# Patient Record
Sex: Female | Born: 1960 | Race: Black or African American | Hispanic: No | Smoking: Current some day smoker
Health system: Southern US, Community
[De-identification: ages and names within clinical notes are randomized; demographics above are authoritative.]

## PROBLEM LIST (undated history)

## (undated) DIAGNOSIS — S43006A Unspecified dislocation of unspecified shoulder joint, initial encounter: Secondary | ICD-10-CM

---

## 2017-05-04 ENCOUNTER — Encounter (INDEPENDENT_AMBULATORY_CARE_PROVIDER_SITE_OTHER): Payer: Self-pay | Admitting: Orthopedic Surgery

## 2017-05-04 ENCOUNTER — Ambulatory Visit (INDEPENDENT_AMBULATORY_CARE_PROVIDER_SITE_OTHER): Payer: PRIVATE HEALTH INSURANCE | Admitting: Orthopedic Surgery

## 2017-05-04 ENCOUNTER — Ambulatory Visit (INDEPENDENT_AMBULATORY_CARE_PROVIDER_SITE_OTHER): Payer: PRIVATE HEALTH INSURANCE

## 2017-05-04 DIAGNOSIS — G8929 Other chronic pain: Secondary | ICD-10-CM | POA: Diagnosis not present

## 2017-05-04 DIAGNOSIS — M5442 Lumbago with sciatica, left side: Secondary | ICD-10-CM

## 2017-05-04 NOTE — Progress Notes (Signed)
Office Visit Note   Patient: Brenda Adams           Date of Birth: 01-21-1961           MRN: [id number] Visit Date: 05/04/2017 Requested by: No referring provider defined for this encounter. PCP: System, Pcp Not In  Subjective: Chief Complaint  Patient presents with  . Lower Back - Pain    HPI: Brenda Adams is a 56 year old patient with low back pain.  She's referred here from the rehabilitation.  She's had pain for 20 years.  States the pain is constant.  She denies any prior surgery.  Does state that the pain will radiate down the left leg at times of some numbness and tingling in this area.  Occasionally she will wake with pain.  She has been to a [facility].  She has not had injections.  She does state that she had an MRI scan in March.  She may be trying to achieve service connection with this back injury because she states that she is working with the [facility] with this as well.  She states that her back symptoms to prevent other type of more physical work.  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  She does describe no limitation of walking endurance.  Does report some paresthesias in the lateral left leg.  She is seen in nutrition is to who is recommended working out 5 days a week.  She's been working out for less than a month doing walking weights and abdominal work.              ROS: All systems reviewed are negative as they relate to the chief complaint within the history of present illness.  Patient denies  fevers or chills.   Assessment & Plan: Visit Diagnoses:  1. Chronic midline low back pain with left-sided sciatica     Plan: Impression is low back pain with fairly significant degenerative changes on plain radiographs.  The patient may be having some foraminal stenosis of osteophytes affecting the left nerve root.  In general sedentary type work with no lifting more than 10-15 pounds occasionally would be her best  option.  She stated that she was thinking about doing nursing but I don't know with her back to waiting is that that'll be her best option for employment.  In general something sedentary or with light activity which involves less than 15 pounds of lifting is going to be more ideal for her.  I'll see her back as needed  Follow-Up Instructions: No Follow-up on file.   Orders:  Orders Placed This Encounter  Procedures  . XR Lumbar Spine 2-3 Views   No orders of the defined types were placed in this encounter.     Procedures: No procedures performed   Clinical Data: No additional findings.  Objective: Vital Signs: There were no vitals taken for this visit.  Physical Exam:   Constitutional: Patient appears well-developed HEENT:  Head: Normocephalic Eyes:EOM are normal Neck: Normal range of motion Cardiovascular: Normal rate Pulmonary/chest: Effort normal Neurologic: Patient is alert Skin: Skin is warm Psychiatric: Patient has normal mood and affect    Ortho Exam: Orthopedic exam demonstrates normal gait alignment palpable pedal pulses good ankle dorsi and plantar flexion quite hamstring strength mild paresthesias L5 disc extrusion on the left negative Babinski negative clonus no groin pain with internal/external rotation of the leg not much in the way of pain with forward  lateral bending and no trochanteric tenderness is noted.  Specialty Comments:  No specialty comments available.  Imaging: Xr Lumbar Spine 2-3 Views  Result Date: 05/04/2017 AP lateral lumbar spine reviewed.  Hip joints and sacroiliac joints appear normal.  No fractures present.  Mild scoliosis which is degenerative in nature is present in the lower lumbar spine.  Significant degenerative disc disease noted at L4-5 and L5-S1 with facet arthritis present.  Disc spaces are maintained at L1-2 and above.    PMFS History: There are no active problems to display for this patient.  No past medical history on  file.  No family history on file.  No past surgical history on file. Social History   Occupational History  . Not on file.   Social History Main Topics  . Smoking status: Current Some Day Smoker  . Smokeless tobacco: Never Used  . Alcohol use Not on file  . Drug use: Unknown  . Sexual activity: Not on file

## 2017-06-05 ENCOUNTER — Telehealth (INDEPENDENT_AMBULATORY_CARE_PROVIDER_SITE_OTHER): Payer: Self-pay | Admitting: Orthopedic Surgery

## 2017-06-05 NOTE — Telephone Encounter (Signed)
05/04/2017 OV note faxed to [facility] [phone]

## 2017-12-08 ENCOUNTER — Other Ambulatory Visit: Payer: Self-pay

## 2017-12-08 ENCOUNTER — Emergency Department (HOSPITAL_COMMUNITY): Payer: Self-pay

## 2017-12-08 ENCOUNTER — Encounter (HOSPITAL_COMMUNITY): Payer: Self-pay | Admitting: Emergency Medicine

## 2017-12-08 ENCOUNTER — Emergency Department (HOSPITAL_COMMUNITY)
Admission: EM | Admit: 2017-12-08 | Discharge: 2017-12-08 | Disposition: A | Discharge status: Home / Self Care | Payer: Self-pay | Attending: Emergency Medicine | Admitting: Emergency Medicine

## 2017-12-08 DIAGNOSIS — Y929 Unspecified place or not applicable: Secondary | ICD-10-CM | POA: Insufficient documentation

## 2017-12-08 DIAGNOSIS — F17200 Nicotine dependence, unspecified, uncomplicated: Secondary | ICD-10-CM | POA: Insufficient documentation

## 2017-12-08 DIAGNOSIS — Y999 Unspecified external cause status: Secondary | ICD-10-CM | POA: Insufficient documentation

## 2017-12-08 DIAGNOSIS — Z79899 Other long term (current) drug therapy: Secondary | ICD-10-CM | POA: Insufficient documentation

## 2017-12-08 DIAGNOSIS — X509XXA Other and unspecified overexertion or strenuous movements or postures, initial encounter: Secondary | ICD-10-CM | POA: Insufficient documentation

## 2017-12-08 DIAGNOSIS — S43005A Unspecified dislocation of left shoulder joint, initial encounter: Secondary | ICD-10-CM | POA: Insufficient documentation

## 2017-12-08 DIAGNOSIS — Y9389 Activity, other specified: Secondary | ICD-10-CM | POA: Insufficient documentation

## 2017-12-08 HISTORY — DX: Unspecified dislocation of unspecified shoulder joint, initial encounter: S43.006A

## 2017-12-08 NOTE — ED Triage Notes (Signed)
Pt st's she was cleaning and dislocated her left shoulder.  Pt st's she has dislocated it in the past and it usually goes back in but today it did not.  Pt c/o severe pain to shoulder

## 2017-12-08 NOTE — ED Provider Notes (Signed)
I saw and evaluated the patient, reviewed the resident's note and I agree with the findings and plan.   EKG Interpretation None     57 year old female presents with severe left shoulder pain when she reached for something while doing housework.  History of recurrent shoulder dislocation.  X-ray here shows anterior shoulder dislocation which was relocated by the resident.  Please see his note.  On my exam, patient is now relocated after his reduction.  Will check postreduction films.  Will give orthopedic referral   Lorre NickAllen, Farah Benish, MD 12/08/17 2121

## 2017-12-08 NOTE — ED Notes (Signed)
Pt s slt shoulder has been dislocated  Since 1800 she did while cleaning  Previous dislocations   She drove herself here

## 2017-12-08 NOTE — ED Provider Notes (Signed)
[facility] Provider Note   CSN: [id number] Arrival date & time: 12/08/17  2020     History   Chief Complaint Chief Complaint  Patient presents with  . Shoulder Injury    HPI Brenda Adams is a 57 y.o. female.  HPI  57 year old right-handed female presents the emergency department status post suspected dislocation to the left shoulder with known history of chronic subluxation/dislocation.  Patient states this happened 5 hours ago with no acute mechanism of injury.  Patient describes mild paresthesia to the fingertips.  Negative weakness/paralysis.  Negative antiplatelet/anticoagulant therapy.  Past Medical History:  Diagnosis Date  . Dislocated shoulder     There are no active problems to display for this patient.   History reviewed. No pertinent surgical history.  OB History    No data available       Home Medications    Prior to Admission medications   Medication Sig Start Date End Date Taking? Authorizing Provider  loratadine (CLARITIN) 10 MG tablet Take 10 mg by mouth daily.   Yes [provider]    Family History No family history on file.  Social History Social History   Tobacco Use  . Smoking status: Current Some Day Smoker  . Smokeless tobacco: Never Used  Substance Use Topics  . Alcohol use: No    Frequency: Never  . Drug use: No     Allergies   Patient has no known allergies.   Review of Systems Review of Systems  Review of Systems  Constitutional: Negative for fever and chills.  HENT: Negative for ear pain, sore throat and trouble swallowing.   Eyes: Negative for pain and visual disturbance.  Respiratory: Negative for cough and shortness of breath.   Cardiovascular: Negative for chest pain and leg swelling.  Gastrointestinal: Negative for nausea, vomiting, abdominal pain and diarrhea.  Genitourinary: Negative for dysuria, urgency and frequency.  Musculoskeletal: see HPI  Skin:  Negative for rash and wound.  Neurological: Negative for dizziness, syncope, speech difficulty, weakness and numbness.   Physical Exam Updated Vital Signs BP (!) 129/118   Pulse 67   Temp 98.5 F (36.9 C) (Oral)   Resp 15   Ht 5\' 8"  (1.727 m)   Wt 111.6 kg (246 lb)   LMP  (Exact Date)   SpO2 98%   BMI 37.40 kg/m   Physical Exam  Physical Exam Vitals:   12/08/17 2115 12/08/17 2145  BP: (!) 125/107 (!) 129/118  Pulse: 67   Resp: 15   Temp:    SpO2: 98%    Constitutional: Patient is in no acute distress Head: Normocephalic and atraumatic.  Eyes: Extraocular motion intact, no scleral icterus Neck: Supple without meningismus, mass, or overt JVD Respiratory: Effort normal and breath sounds normal. No respiratory distress. CV: Heart regular rate and rhythm, no obvious murmurs.  Pulses +2 and symmetric Abdomen: Soft, non-tender, non-distended MSK: L shoulder deformity; pulse 2+; sensory intact.  Skin: Warm, dry, intact Neuro: Alert and oriented, no motor deficit noted Psychiatric: Mood and affect are normal.  ED Treatments / Results  Labs (all labs ordered are listed, but only abnormal results are displayed) Labs Reviewed - No data to display  EKG  EKG Interpretation None       Radiology Dg Shoulder Left  Result Date: 12/08/2017 CLINICAL DATA:  Post reduction of left shoulder dislocation EXAM: LEFT SHOULDER - 2+ VIEW COMPARISON:  None. FINDINGS: Satisfactory reduction of left humeral head. [facility] and glenohumeral  joints are maintained. No definite bony Bankart or Hill-Sachs deformity is identified post reduction. The included left ribs and lung are nonacute. IMPRESSION: Satisfactory reduction of previously dislocated left humeral head. No bony Bankart or Hill-Sachs deformity. Electronically Signed   By: Tollie Eth M.D.   On: 12/08/2017 22:02   Dg Shoulder Left  Result Date: 12/08/2017 CLINICAL DATA:  Recurrent shoulder dislocation. EXAM: LEFT SHOULDER - 2+ VIEW  COMPARISON:  None. FINDINGS: Anterior subcoracoid left humeral head dislocation is noted with probable Hill-Sachs deformity. No definite bony Bankart lesion. AC joint is maintained. The adjacent ribs and lung are nonacute. IMPRESSION: Anterior subcoracoid left humeral head dislocation with probable Sachs deformity. Electronically Signed   By: Tollie Eth M.D.   On: 12/08/2017 21:26    Procedures Reduction of dislocation Date/Time: 12/08/2017 9:22 PM Performed by: Jaynie Husband, DO Authorized by: Jaynie Guard, DO  Consent: Verbal consent obtained. Consent given by: patient Patient identity confirmed: verbally with patient and arm band Local anesthesia used: no  Anesthesia: Local anesthesia used: no  Sedation: Patient sedated: no  Patient tolerance: Patient tolerated the procedure well with no immediate complications Comments: Manual reduction of L shoulder. NVI post reduction. Films confirm reduction.     (including critical care time)  Medications Ordered in ED Medications - No data to display   Initial Impression / Assessment and Plan / ED Course  I have reviewed the triage vital signs and the nursing notes.  Pertinent labs & imaging results that were available during my care of the patient were reviewed by me and considered in my medical decision making (see chart for details).     57 year old right-handed female presents the emergency department status post suspected dislocation to the left shoulder with known history of chronic subluxation/dislocation.  Patient states this happened 5 hours ago with no acute mechanism of injury.  Patient describes mild paresthesia to the fingertips.  Negative weakness/paralysis.  Negative antiplatelet/anticoagulant therapy.  Patient arrived here medically stable well-appearing.  Physical exam as annotated above consistent with history/clinical presentation of left shoulder dislocation.  X-rays confirm anterior shoulder dislocation.  Manual  reduction performed in the emergency department with post reduction films confirming reduction.  Patient has no neurovascular deficits.  Plan for follow-up to orthopedics.  Patient instructed conservative therapy to include swelling and anti-inflammatory meds.  Good return precautions were given.  Final Clinical Impressions(s) / ED Diagnoses   Final diagnoses:  Dislocation of left shoulder joint, initial encounter    ED Discharge Orders    None       Jaynie Solomon, DO 12/08/17 2224

## 2018-07-14 IMAGING — DX DG SHOULDER 2+V*L*
2 series · 2 of 2 positions shown · non-contrast
Comparison: None.

CLINICAL DATA: Recurrent shoulder dislocation.

EXAM:
LEFT SHOULDER - 2+ VIEW

[shoulder y view]
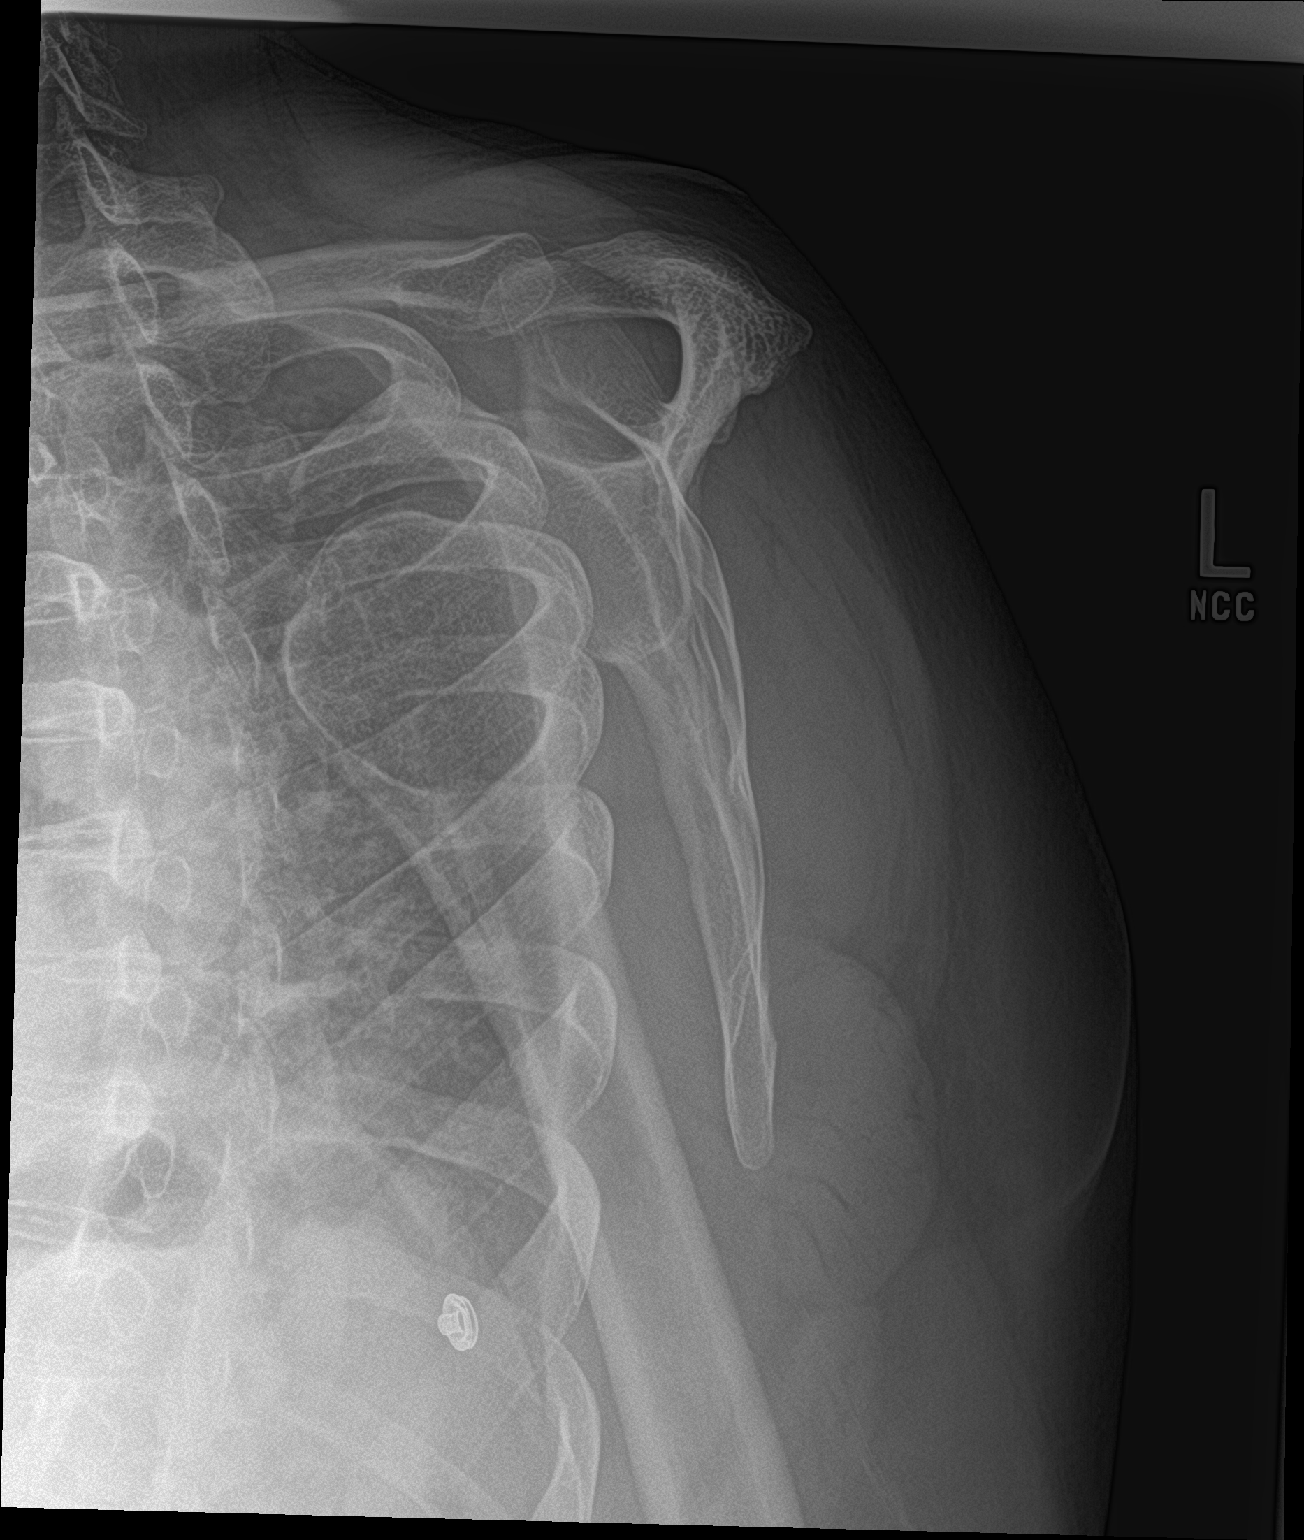

[shoulder ap neutral]
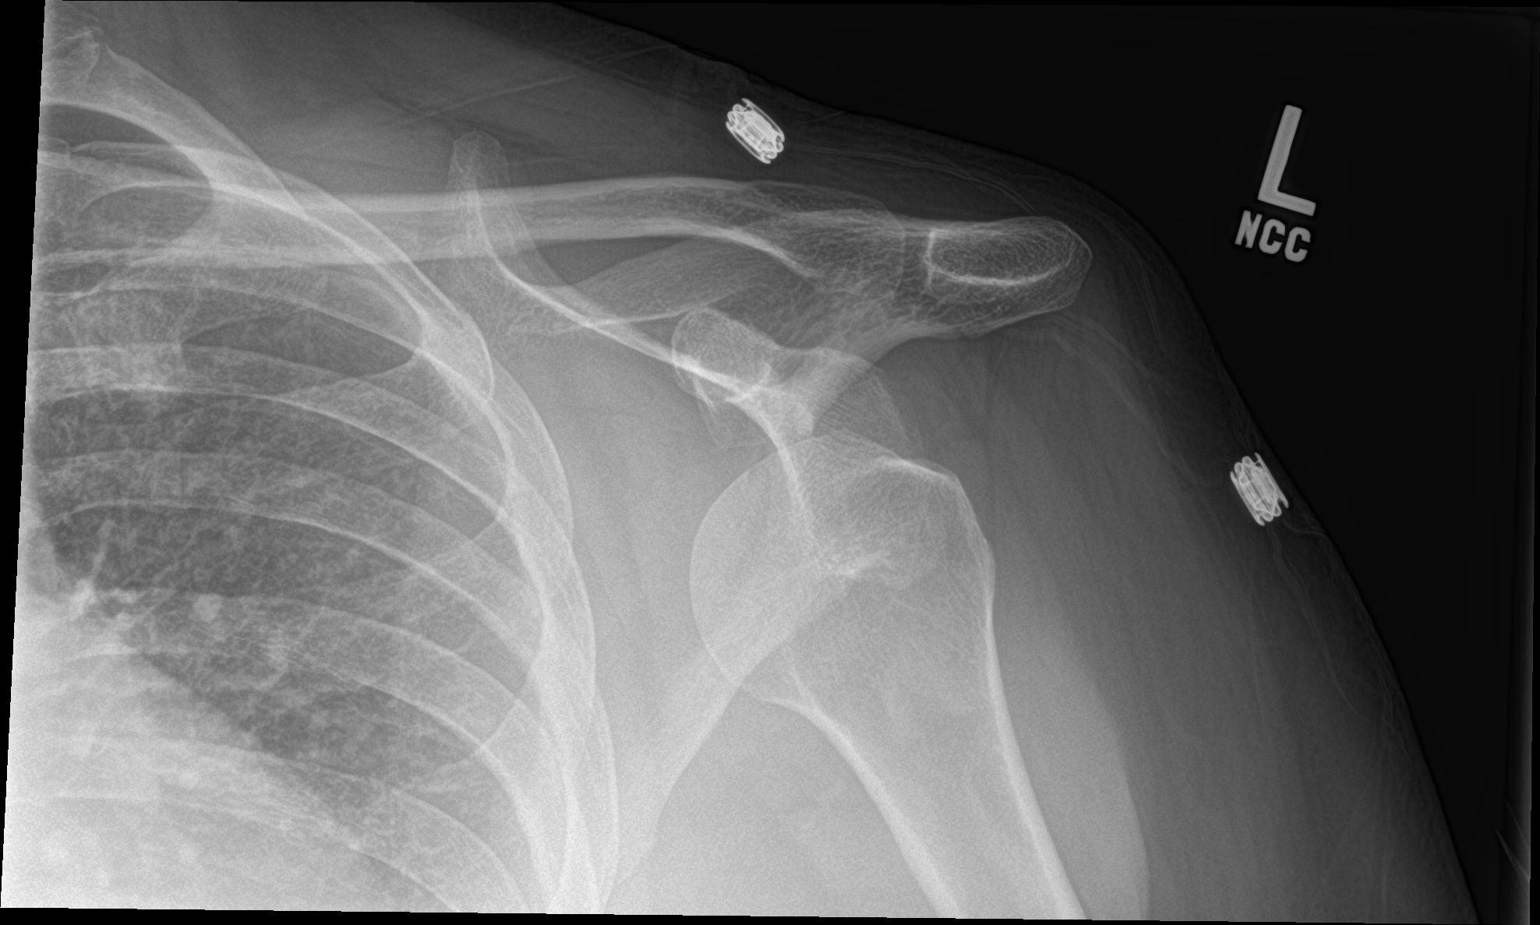

[2 of 2 positions shown; findings below may reference images not displayed]

FINDINGS: Anterior subcoracoid left humeral head dislocation is noted with
probable Hill-Sachs deformity. No definite bony Bankart lesion. AC
joint is maintained. The adjacent ribs and lung are nonacute.
IMPRESSION: Anterior subcoracoid left humeral head dislocation with probable
Sachs deformity.

## 2022-09-28 ENCOUNTER — Other Ambulatory Visit (HOSPITAL_BASED_OUTPATIENT_CLINIC_OR_DEPARTMENT_OTHER): Payer: Self-pay | Admitting: Orthopedic Surgery

## 2022-09-28 DIAGNOSIS — M25512 Pain in left shoulder: Secondary | ICD-10-CM

## 2022-10-06 ENCOUNTER — Ambulatory Visit (HOSPITAL_BASED_OUTPATIENT_CLINIC_OR_DEPARTMENT_OTHER)
Admission: RE | Admit: 2022-10-06 | Discharge: 2022-10-06 | Disposition: A | Discharge status: Home / Self Care | Payer: No Typology Code available for payment source | Source: Ambulatory Visit | Attending: Orthopedic Surgery | Admitting: Orthopedic Surgery

## 2022-10-06 DIAGNOSIS — M19012 Primary osteoarthritis, left shoulder: Secondary | ICD-10-CM | POA: Diagnosis not present

## 2022-10-06 DIAGNOSIS — M67814 Other specified disorders of tendon, left shoulder: Secondary | ICD-10-CM | POA: Insufficient documentation

## 2022-10-06 DIAGNOSIS — G8929 Other chronic pain: Secondary | ICD-10-CM | POA: Diagnosis present

## 2022-10-06 DIAGNOSIS — M25512 Pain in left shoulder: Secondary | ICD-10-CM

## 2024-08-28 ENCOUNTER — Emergency Department (HOSPITAL_BASED_OUTPATIENT_CLINIC_OR_DEPARTMENT_OTHER)

## 2024-08-28 ENCOUNTER — Other Ambulatory Visit: Payer: Self-pay

## 2024-08-28 ENCOUNTER — Emergency Department (HOSPITAL_BASED_OUTPATIENT_CLINIC_OR_DEPARTMENT_OTHER)
Admission: EM | Admit: 2024-08-28 | Discharge: 2024-08-28 | Disposition: A | Discharge status: Home / Self Care | Attending: Emergency Medicine | Admitting: Emergency Medicine

## 2024-08-28 DIAGNOSIS — I10 Essential (primary) hypertension: Secondary | ICD-10-CM | POA: Insufficient documentation

## 2024-08-28 DIAGNOSIS — Z720 Tobacco use: Secondary | ICD-10-CM | POA: Insufficient documentation

## 2024-08-28 DIAGNOSIS — Z79899 Other long term (current) drug therapy: Secondary | ICD-10-CM | POA: Insufficient documentation

## 2024-08-28 DIAGNOSIS — H538 Other visual disturbances: Secondary | ICD-10-CM | POA: Diagnosis not present

## 2024-08-28 DIAGNOSIS — H5711 Ocular pain, right eye: Secondary | ICD-10-CM | POA: Insufficient documentation

## 2024-08-28 LAB — BASIC METABOLIC PANEL WITH GFR
Anion gap: 11 (ref 5–15)
BUN: 12 mg/dL (ref 8–23)
CO2: 24 mmol/L (ref 22–32)
Calcium: 9.8 mg/dL (ref 8.9–10.3)
Chloride: 106 mmol/L (ref 98–111)
Creatinine, Ser: 0.96 mg/dL (ref 0.44–1.00)
GFR, Estimated: >60 mL/min (ref >60)
Glucose, Bld: 130 mg/dL — ABNORMAL HIGH (ref 70–99)
Potassium: 4.1 mmol/L (ref 3.5–5.1)
Sodium: 140 mmol/L (ref 135–145)

## 2024-08-28 LAB — CBC WITH DIFFERENTIAL/PLATELET
Abs Immature Granulocytes: 0.03 K/uL (ref 0.00–0.07)
Basophils Absolute: 0.1 K/uL (ref 0.0–0.1)
Basophils Relative: 1 %
Eosinophils Absolute: 0.2 K/uL (ref 0.0–0.5)
Eosinophils Relative: 2 %
HCT: 40.1 % (ref 36.0–46.0)
Hemoglobin: 13.5 g/dL (ref 12.0–15.0)
Immature Granulocytes: 0 %
Lymphocytes Relative: 42 %
Lymphs Abs: 3.3 K/uL (ref 0.7–4.0)
MCH: 31.2 pg (ref 26.0–34.0)
MCHC: 33.7 g/dL (ref 30.0–36.0)
MCV: 92.6 fL (ref 80.0–100.0)
Monocytes Absolute: 0.6 K/uL (ref 0.1–1.0)
Monocytes Relative: 8 %
Neutro Abs: 3.7 K/uL (ref 1.7–7.7)
Neutrophils Relative %: 47 %
Platelets: 262 K/uL (ref 150–400)
RBC: 4.33 MIL/uL (ref 3.87–5.11)
RDW: 15.1 % (ref 11.5–15.5)
WBC: 7.8 K/uL (ref 4.0–10.5)
nRBC: 0 % (ref 0.0–0.2)

## 2024-08-28 MED ORDER — TETRACAINE HCL 0.5 % OP SOLN
1.0000 [drp] | Freq: Once | OPHTHALMIC | Status: AC
  Administered 2024-08-28: 1 [drp] via OPHTHALMIC
  Filled 2024-08-28: qty 4

## 2024-08-28 MED ORDER — FLUORESCEIN SODIUM 1 MG OP STRP
1.0000 | ORAL_STRIP | Freq: Once | OPHTHALMIC | Status: DC
  Filled 2024-08-28: qty 1

## 2024-08-28 NOTE — ED Notes (Signed)
  fluorescein ophthalmic strip 1 strip and tetracaine at bedside

## 2024-08-28 NOTE — ED Triage Notes (Signed)
 Pt POV reporting seeing blurry white patch in R eye and pain pain when looking around since Sunday.

## 2024-08-28 NOTE — ED Notes (Signed)

## 2024-08-28 NOTE — ED Provider Notes (Signed)
 [place] EMERGENCY DEPARTMENT AT [facility] Provider Note   CSN: [id number] Arrival date & time: 08/28/24  1509     Patient presents with: Eye Pain   Brenda Adams is a 63 y.o. female with past medical history of HTN, tobacco use, migraines presents emergency department for evaluation of visual disturbance that started 4 days ago.  Reports that symptoms initially started with complete loss of vision in her right eye that lasted 3-5 seconds and spontaneously resolved.  Following this, she had white spots mostly located in her medial aspect of her vision.  She states it feels like everything is brighter and blurry in the right eye.  Also has associated right eye pain that does not worsen with EOM just is constant.  Has a history of migraines but has not had any headaches nor does this feel like her typical migraine. Denies dizziness, traumatic injury, fevers     Eye Pain       Prior to Admission medications   Medication Sig Start Date End Date Taking? Authorizing Provider  loratadine (CLARITIN) 10 MG tablet Take 10 mg by mouth daily.    [provider]    Allergies: Penicillins    Review of Systems  Eyes:  Positive for pain.    Updated Vital Signs BP (!) 154/92 (BP Location: Right Arm)   Pulse (!) 58   Temp 97.9 F (36.6 C)   Resp 18   Ht 5' 8 (1.727 m)   Wt 106.6 kg   SpO2 97%   BMI 35.73 kg/m   Physical Exam Vitals and nursing note reviewed.  Constitutional:      General: She is not in acute distress.    Appearance: Normal appearance.  HENT:     Head: Normocephalic and atraumatic.  Eyes:     General: Visual field deficit present.     Intraocular pressure: Right eye pressure is 16 mmHg.     Extraocular Movements:     Right eye: Normal extraocular motion and no nystagmus.     Left eye: Normal extraocular motion and no nystagmus.     Conjunctiva/sclera: Conjunctivae normal.     Right eye: Right conjunctiva is not injected. No chemosis,  exudate or hemorrhage.    Left eye: Left conjunctiva is not injected. No chemosis, exudate or hemorrhage.    Comments: No obvious vitreous hemorrhage nor retinal detachment on ocular US   Cardiovascular:     Rate and Rhythm: Normal rate.  Pulmonary:     Effort: Pulmonary effort is normal. No respiratory distress.  Skin:    Coloration: Skin is not jaundiced or pale.  Neurological:     Mental Status: She is alert. Mental status is at baseline.     GCS: GCS eye subscore is 4. GCS verbal subscore is 5. GCS motor subscore is 6.     Cranial Nerves: Cranial nerve deficit present. No dysarthria or facial asymmetry.     Sensory: Sensory deficit present.     Motor: No weakness, tremor, abnormal muscle tone, seizure activity or pronator drift.     Coordination: Coordination is intact. Coordination normal. Finger-Nose-Finger Test and Heel to [facility] Test normal.     Gait: Gait is intact.     Comments: Motor 5/5 BUE and BLE.  Reported sensation 1/2 of LUE.  2/2 of RUE, BLE.  No slurred speech, aphasia, nor agnosia. Reports blurred vision when attempting to visualize out of right eye only     (all labs ordered are listed, but only  abnormal results are displayed) Labs Reviewed  BASIC METABOLIC PANEL WITH GFR - Abnormal; Notable for the following components:      Result Value   Glucose, Bld 130 (*)    All other components within normal limits  CBC WITH DIFFERENTIAL/PLATELET    EKG: None  Radiology: No results found.   Ultrasound ED Ocular  Date/Time: 08/28/2024 5:36 PM  Performed by: Minnie Tinnie BRAVO, PA Authorized by: Minnie Tinnie BRAVO, PA   PROCEDURE DETAILS:    Assessed:  Right eye   Images: archived   RIGHT EYE FINDINGS:     no foreign body noted in right eye    no evidence of retinal detachment of the right eye    no vitreous hemorrhage in right eye    Medications Ordered in the ED  tetracaine (PONTOCAINE) 0.5 % ophthalmic solution 1 drop (1 drop Right Eye Given by Other 08/28/24  1616)                                    Medical Decision Making Amount and/or Complexity of Data Reviewed Labs: ordered. Radiology: ordered.  Risk Prescription drug management.   Patient presents to the ED for concern of blurred vision, pain with EOM, this involves an extensive number of treatment options, and is a complaint that carries with it a high risk of complications and morbidity.  The differential diagnosis includes acute glaucoma, foreign body, corneal ulcer, corneal abrasion, retinal detachment, vitreous hemorrhage, migraine, CVA/TIA.  Not an exhaustive list   Co morbidities that complicate the patient evaluation  HTN, tobacco use, migraines    Additional history obtained:  Additional history obtained from Nursing   External records from outside source obtained and reviewed including triage RN note   Lab Tests:  I Ordered, and personally interpreted labs.  The pertinent results include:   CBG 130   Imaging Studies ordered:  I ordered imaging studies including CT head, orbits  I independently visualized and interpreted imaging which showed no acute intracranial or intra orbital abnormalities I agree with the radiologist interpretation     Consultations Obtained:  I requested consultation with neurology Dr. Remi,  and discussed lab and imaging findings as well as pertinent plan - they recommend:  Normal CT head. No further neurological workup in ED from their standpoint Consult ophthalmology  I requested consultation with ophthalmology Dr. Marcey,  and discussed lab and imaging findings as well as pertinent plan Follow up in office tomorrow Provided ophthalmology information on DC paperwork   Problem List / ED Course:  Eye pain Visual deficit Vital signs hemodynamically stable.  Blood pressure 152/92 CT head and orbits wo acute abnormalities Does have subjective deficit of decreased sensation 1/2 of LUE but patient is unable to tell me how long  this has been present for and is unsure if this is acute.  She states I have never noticed this before.  Sharp sensation intact  Fortunately, no pronator drift nor motor deficits to BUE.  No other motor or sensory deficits to BLE.  Able to ambulate without difficulty nor needing assistance.  No complaints of dizziness.  No speech deficits nor facial deficits.  Did discuss incidental finding of decreased sensation of LUE with neurology as well as overall patient presentation.  As it is unsure whether this is acute or not, as well as as normal CT head, recommend no further ED workup for CVA/TIA and recommend  ophthalmology follow-up Denies traumatic injury.  EOM intact.  Pain does not worsen with EOM just remains constant No signs of foreign body, corneal abrasion, corneal ulcer, hyphema, chemosis on eye exam IOP 16 in right eye. Does not appear associated with acute glaucoma No current headaches nor has she had a HA since symptoms started. Unlikely migraine Ocular US  wo gross vitreous hemorrhage or retinal detachment on my exam Consulted ophtho as noted above and will have pt f/u with them in office tomorrow morning for further management. Did provide this information on DC paperwork Provided strict return precautions to include s/sx of CVA/TIA, worsening symtoms    Reevaluation:  After the interventions noted above, I reevaluated the patient and found that they have :stayed the same     Dispostion:  After consideration of the diagnostic results and the patients response to treatment, I feel that the patent would benefit from ophthalmology follow-up tomorrow.   Discussed ED workup, disposition, return to ED precautions with patient who expresses understanding agrees with plan.  All questions answered to their satisfaction.  They are agreeable to plan.  Discharge instructions provided on paperwork  Final diagnoses:  Blurred vision, right eye  Acute right eye pain    ED Discharge Orders      None        Minnie Tinnie BRAVO, PA 09/01/24 1436    Cottie Donnice PARAS, MD 09/02/24 1326

## 2024-08-28 NOTE — Discharge Instructions (Addendum)
 Thank you for letting us  evaluate you today.  Please follow-up with ophthalmology/eye doctor tomorrow morning per recommendation of Dr. Marcey.  They will see you tomorrow morning.  Make an appointment to see them tomorrow morning.  This is very important that you see them tomorrow morning.

## 2024-09-01 ENCOUNTER — Emergency Department (HOSPITAL_BASED_OUTPATIENT_CLINIC_OR_DEPARTMENT_OTHER)
Admission: EM | Admit: 2024-09-01 | Discharge: 2024-09-01 | Disposition: A | Discharge status: Home / Self Care | Attending: Emergency Medicine | Admitting: Emergency Medicine

## 2024-09-01 ENCOUNTER — Other Ambulatory Visit: Payer: Self-pay

## 2024-09-01 ENCOUNTER — Encounter (HOSPITAL_BASED_OUTPATIENT_CLINIC_OR_DEPARTMENT_OTHER): Payer: Self-pay

## 2024-09-01 DIAGNOSIS — H539 Unspecified visual disturbance: Secondary | ICD-10-CM

## 2024-09-01 DIAGNOSIS — H538 Other visual disturbances: Secondary | ICD-10-CM | POA: Diagnosis present

## 2024-09-01 DIAGNOSIS — F17200 Nicotine dependence, unspecified, uncomplicated: Secondary | ICD-10-CM | POA: Diagnosis not present

## 2024-09-01 DIAGNOSIS — I10 Essential (primary) hypertension: Secondary | ICD-10-CM | POA: Insufficient documentation

## 2024-09-01 NOTE — ED Triage Notes (Signed)
 Patient reports a blind spot in her right eye. She says that she also has carotid pain. Seen here last week for same. Arrives with papers from the [place] requesting carotid ultrasound and imaging.

## 2024-09-01 NOTE — Discharge Instructions (Signed)
 You are seen today for vision changes.  With ophthalmology having cleared you and neurology also having consulted last time you are in the emergency department, and no new or changing symptoms, I believe is reasonable for you to be discharged this time.  However you will still need to follow-up with PCP to have them order the duplex ultrasound that they requested.  Return to the ER though if you have any new or worsening symptoms.

## 2024-09-01 NOTE — ED Provider Notes (Signed)
 [place] EMERGENCY DEPARTMENT AT [facility] Provider Note   CSN: [id number] Arrival date & time: 09/01/24  1618     Patient presents with: Neck Injury and Eye Problem   Carmina Walle is a 63 y.o. female.   Neck Injury Associated symptoms include headaches.  Eye Problem Associated symptoms: headaches    Patient is a 63 year old female presented ED today for concerns for right eye intermittent blurry vision and color loss accompanied with light sensitivity accompanied with some right sided neck pain and headache described as constant pressure to forehead.  Reports that has been persistent x 4 days, seen by ophthalmology 3 days ago and was told nothing was wrong.  Saw PCP at [facility] today and was told to come into the emergency department for a carotid ultrasound.   Seen previously emergency department 08/28/2024.   States that she has not had any new symptoms.  Denies fever, vertigo, tinnitus, otalgia, dysphagia, odynophagia, chest pain, shortness of breath, numbness, weakness, tingling, unilateral weakness.  Prior to Admission medications   Medication Sig Start Date End Date Taking? Authorizing Provider  loratadine (CLARITIN) 10 MG tablet Take 10 mg by mouth daily.    [provider]    Allergies: Penicillins    Review of Systems  Eyes:  Positive for visual disturbance.  Neurological:  Positive for headaches.  All other systems reviewed and are negative.   Updated Vital Signs BP (!) 143/88   Pulse 64   Temp 97.9 F (36.6 C) (Temporal)   Resp 18   SpO2 100%   Physical Exam Vitals and nursing note reviewed.  Constitutional:      General: She is not in acute distress.    Appearance: Normal appearance. She is not ill-appearing or diaphoretic.  HENT:     Head: Normocephalic and atraumatic.  Eyes:     General: Lids are normal. Vision grossly intact. Gaze aligned appropriately. No scleral icterus.       Right eye: No discharge.        Left eye: No  discharge.     Extraocular Movements: Extraocular movements intact.     Right eye: No nystagmus.     Left eye: No nystagmus.     Conjunctiva/sclera: Conjunctivae normal.     Right eye: Right conjunctiva is not injected. No chemosis, exudate or hemorrhage.    Left eye: Left conjunctiva is not injected. No chemosis, exudate or hemorrhage.    Pupils: Pupils are equal, round, and reactive to light. Pupils are equal.     Right eye: Pupil is round, reactive and not sluggish.     Left eye: Pupil is round, reactive and not sluggish.     Visual Fields: Right eye visual fields normal and left eye visual fields normal.  Cardiovascular:     Rate and Rhythm: Normal rate and regular rhythm.     Pulses: Normal pulses.     Heart sounds: Normal heart sounds. No murmur heard.    No friction rub. No gallop.  Pulmonary:     Effort: Pulmonary effort is normal. No respiratory distress.     Breath sounds: No stridor. No wheezing, rhonchi or rales.  Chest:     Chest wall: No tenderness.  Abdominal:     General: Abdomen is flat. There is no distension.     Palpations: Abdomen is soft.     Tenderness: There is no abdominal tenderness. There is no right CVA tenderness, left CVA tenderness, guarding or rebound.  Musculoskeletal:  General: No swelling, deformity or signs of injury.     Cervical back: Normal range of motion. No rigidity.     Right lower leg: No edema.     Left lower leg: No edema.  Skin:    General: Skin is warm and dry.     Findings: No bruising, erythema or lesion.  Neurological:     General: No focal deficit present.     Mental Status: She is alert and oriented to person, place, and time. Mental status is at baseline.     Sensory: No sensory deficit.     Motor: No weakness.  Psychiatric:        Mood and Affect: Mood normal.     (all labs ordered are listed, but only abnormal results are displayed) Labs Reviewed - No data to display  EKG: None  Radiology: No results  found.  Procedures   Medications Ordered in the ED - No data to display   Medical Decision Making  This patient is a 63 year old female who presents to the ED for concern of requesting ultrasound of carotids per PCP.  Has had symptoms of blurry vision, mild persistent frontal headache, color loss in right eye.  Seen by ophthalmology 3 days ago after being seen in the emergency department 4 days ago for same complaint.  Did not find any acute findings at that time.  Was following up with PCP today who told her to come and get carotid ultrasound done.  Neurology was consulted at her initial visit and did not require any further workup from their end.    On physical exam, patient is in no acute distress, afebrile, alert and orient x 4, speaking in full sentences, nontachypneic, nontachycardic.  Visual fields intact, EOM intact, PERRL, no chemosis or injection noted.  No acute changes from where she was 4 days ago when she was discharged.  Patient is wished to go home and not wanting to undergo CTA or further evaluation as she needs to take out her dogs.  With her not having any new symptoms from when she was previously discharged and was likely sent over for nonemergent imaging, believe is reasonable for her to be sent home at this time and follow-up with PCP for further outpatient ultrasound.  Attending also saw patient who agreed that patient could be discharged at this time despite rejecting lab work and imaging.  Expressed that we would not be able to provide any further information nor rule out any other emergent condition if without test but she decided that she did not want to undergo any further testing at this time.  Believe this is reasonable as she has had no changes from when she was previously seen by ER, ophthalmology.  Patient vital signs have remained stable throughout the course of patient's time in the ED. Low suspicion for any other emergent pathology at this time. I believe  this patient is safe to be discharged. Provided strict return to ER precautions. Patient expressed agreement and understanding of plan. All questions were answered.  Differential diagnoses prior to evaluation: The emergent differential diagnosis includes, but is not limited to, amaurosis fugax, optic neuritis, traumatic iritis, open angle glaucoma, CVA, retinal detachment, vitreous hemorrhage,. This is not an exhaustive differential.   Past Medical History / Co-morbidities / Social History: HTN, tobacco use, migraines  Additional history: Chart reviewed. Pertinent results include:   Last seen 08/28/2024 for blurred vision and right eye with acute right eye pain.  Noting 4 days  of visual disturbance that time initially started with complete vision loss to her right eye which lasted 3 to 5 seconds and spontaneously resolved.  CT head and orbits did not show any acute process.  Did have subjective deficit of left upper extremity.  Uncertain of acuity.  Neurology was consulted and did not believe further CVA/TIA workup was necessary at this time.  Recommended ophthalmology follow-up.  Ocular ultrasound not show any gross vitreous hemorrhage or retinal attachment. Consulted with ophthalmology and was post to follow-up with ophthalmology day after.  Medications:  I have reviewed the patients home medicines and have made adjustments as needed.  Critical Interventions: None  Social Determinants of Health: Has good follow-up with PCP  Disposition: After consideration of the diagnostic results and the patients response to treatment, I feel that the patient would benefit from discharge and shortness of breath.   emergency department workup does not suggest an emergent condition requiring admission or immediate intervention beyond what has been performed at this time. The plan is: Follow-up with PCP, return to ER for new or worsening symptoms. The patient is safe for discharge and has been instructed  to return immediately for worsening symptoms, change in symptoms or any other concerns.   Final diagnoses:  Vision changes    ED Discharge Orders     None          Beola Terrall GORMAN DEVONNA 09/01/24 2127    Yolande Lamar BROCKS, MD 09/05/24 1640

## 2024-09-05 ENCOUNTER — Emergency Department (HOSPITAL_BASED_OUTPATIENT_CLINIC_OR_DEPARTMENT_OTHER)

## 2024-09-05 ENCOUNTER — Emergency Department (HOSPITAL_BASED_OUTPATIENT_CLINIC_OR_DEPARTMENT_OTHER)
Admission: EM | Admit: 2024-09-05 | Discharge: 2024-09-05 | Disposition: A | Discharge status: Home / Self Care | Source: Ambulatory Visit | Attending: Emergency Medicine | Admitting: Emergency Medicine

## 2024-09-05 ENCOUNTER — Encounter (HOSPITAL_BASED_OUTPATIENT_CLINIC_OR_DEPARTMENT_OTHER): Payer: Self-pay

## 2024-09-05 ENCOUNTER — Other Ambulatory Visit: Payer: Self-pay

## 2024-09-05 DIAGNOSIS — R519 Headache, unspecified: Secondary | ICD-10-CM | POA: Insufficient documentation

## 2024-09-05 DIAGNOSIS — I10 Essential (primary) hypertension: Secondary | ICD-10-CM | POA: Insufficient documentation

## 2024-09-05 DIAGNOSIS — R6884 Jaw pain: Secondary | ICD-10-CM | POA: Insufficient documentation

## 2024-09-05 DIAGNOSIS — H5461 Unqualified visual loss, right eye, normal vision left eye: Secondary | ICD-10-CM | POA: Insufficient documentation

## 2024-09-05 DIAGNOSIS — M542 Cervicalgia: Secondary | ICD-10-CM | POA: Insufficient documentation

## 2024-09-05 LAB — CBC WITH DIFFERENTIAL/PLATELET
Abs Immature Granulocytes: 0.04 K/uL (ref 0.00–0.07)
Basophils Absolute: 0.1 K/uL (ref 0.0–0.1)
Basophils Relative: 1 %
Eosinophils Absolute: 0.1 K/uL (ref 0.0–0.5)
Eosinophils Relative: 2 %
HCT: 41.4 % (ref 36.0–46.0)
Hemoglobin: 14 g/dL (ref 12.0–15.0)
Immature Granulocytes: 1 %
Lymphocytes Relative: 41 %
Lymphs Abs: 2.5 K/uL (ref 0.7–4.0)
MCH: 31.5 pg (ref 26.0–34.0)
MCHC: 33.8 g/dL (ref 30.0–36.0)
MCV: 93.2 fL (ref 80.0–100.0)
Monocytes Absolute: 0.5 K/uL (ref 0.1–1.0)
Monocytes Relative: 9 %
Neutro Abs: 2.9 K/uL (ref 1.7–7.7)
Neutrophils Relative %: 46 %
Platelets: 275 K/uL (ref 150–400)
RBC: 4.44 MIL/uL (ref 3.87–5.11)
RDW: 15.2 % (ref 11.5–15.5)
WBC: 6.2 K/uL (ref 4.0–10.5)
nRBC: 0 % (ref 0.0–0.2)

## 2024-09-05 LAB — BASIC METABOLIC PANEL WITH GFR
Anion gap: 11 (ref 5–15)
BUN: 11 mg/dL (ref 8–23)
CO2: 23 mmol/L (ref 22–32)
Calcium: 9.8 mg/dL (ref 8.9–10.3)
Chloride: 105 mmol/L (ref 98–111)
Creatinine, Ser: 0.88 mg/dL (ref 0.44–1.00)
GFR, Estimated: >60 mL/min (ref >60)
Glucose, Bld: 96 mg/dL (ref 70–99)
Potassium: 4.4 mmol/L (ref 3.5–5.1)
Sodium: 138 mmol/L (ref 135–145)

## 2024-09-05 LAB — C-REACTIVE PROTEIN: CRP: 0.6 mg/dL (ref <1.0)

## 2024-09-05 LAB — SEDIMENTATION RATE: Sed Rate: 15 mm/h (ref 0–22)

## 2024-09-05 MED ORDER — IOHEXOL 350 MG/ML SOLN
75.0000 mL | Freq: Once | INTRAVENOUS | Status: AC | PRN
  Administered 2024-09-05: 75 mL via INTRAVENOUS

## 2024-09-05 NOTE — ED Triage Notes (Signed)
 Reports ongoing intermittent vision loss in R eye. Head and neck pain. Seen recently for same and sx continue. Reports her doctor at the [facility] called her twice this morning telling her to come to the ER to get a CT scan to check temporal arteritis.

## 2024-09-05 NOTE — Discharge Instructions (Signed)
 You were seen in the emergency department for your vision loss.  You had a CT of the blood vessels in your brain today which incidentally showed 2 small aneurysms that you should have monitored by your primary doctor but no significant blockages.  Your MRI showed no signs of stroke.  Your inflammatory marker was normal meaning it is unlikely that this is temporal arteritis.  You should follow-up with ophthalmology and neurology as an outpatient for further workup and evaluation.  We have given you a referral to neurology.  You should return to the emergency department if you have numbness or weakness in one-sided body compared to the other, severe headaches or any other new or concerning symptoms.

## 2024-09-05 NOTE — ED Notes (Signed)
 Pt d/c instructions, medications, and follow-up care reviewed with pt. Pt verbalized understanding and had no further questions at time of d/c. Pt CA&Ox4, ambulatory, and in NAD at time of d/c

## 2024-09-05 NOTE — ED Provider Notes (Signed)
 [place] EMERGENCY DEPARTMENT AT [facility] Provider Note   CSN: [id number] Arrival date & time: 09/05/24  1059     Patient presents with: Loss of Vision and Headache   Brenda Adams is a 63 y.o. female.   Patient is a 63 year old female with a past medical history of hypertension presenting to the emergency department with vision loss.  The patient states about 10 days ago in the evening she had complete vision loss of the right eye that lasted about 5 to 6 seconds and then was followed by blurred vision and feeling like there is a curtain in the periphery of her eye.  She states that she has been having associated right sided headaches around her right eye and the right side of her neck.  She denies any associated nausea, vomiting, numbness or weakness.  She states that she was initially seen in the ED after this and had an ocular ultrasound that was normal and a CT of her head and orbits that was normal.  She was referred to follow-up with ophthalmology the next day.  She states that her ophthalmology exam did not show explanation for her vision loss.  She states that she then followed up at the [facility] who recommended she return to the ED for carotid ultrasound but she was unable to stay due to the long wait and then followed up with her primary doctor again this week who recommended she return to the ER for workup for temporal arteritis.  She reports occasional jaw pain.  The history is provided by the patient.  Headache      Prior to Admission medications   Medication Sig Start Date End Date Taking? Authorizing Provider  loratadine (CLARITIN) 10 MG tablet Take 10 mg by mouth daily.    [provider]    Allergies: Penicillins    Review of Systems  Neurological:  Positive for headaches.    Updated Vital Signs BP 128/74   Pulse 76   Temp 97.9 F (36.6 C) (Oral)   Resp 18   SpO2 100%   Physical Exam Vitals and nursing note reviewed.  Constitutional:       General: She is not in acute distress.    Appearance: She is well-developed.  HENT:     Head: Normocephalic and atraumatic.     Comments: No temporal tenderness to percussion bilaterally    Mouth/Throat:     Mouth: Mucous membranes are moist.     Pharynx: Oropharynx is clear.  Eyes:     Extraocular Movements: Extraocular movements intact.     Pupils: Pupils are equal, round, and reactive to light.     Comments: Normal visual fields on the left, able to distinguish that I am wearing a blue glove on the right but unable to distinguish number of fingers being held up in all visual fields on the right  Neck:     Comments: No tenderness to palpation Cardiovascular:     Rate and Rhythm: Normal rate and regular rhythm.     Heart sounds: Normal heart sounds.  Pulmonary:     Effort: Pulmonary effort is normal.     Breath sounds: Normal breath sounds.  Abdominal:     Palpations: Abdomen is soft.  Musculoskeletal:        General: Normal range of motion.     Cervical back: Normal range of motion and neck supple.  Skin:    General: Skin is warm and dry.  Neurological:  Mental Status: She is alert and oriented to person, place, and time.     GCS: GCS eye subscore is 4. GCS verbal subscore is 5. GCS motor subscore is 6.     Cranial Nerves: No cranial nerve deficit, dysarthria or facial asymmetry.     Sensory: No sensory deficit.     Motor: No weakness.     Coordination: Coordination normal.  Psychiatric:        Mood and Affect: Mood normal.        Speech: Speech normal.        Behavior: Behavior normal.     (all labs ordered are listed, but only abnormal results are displayed) Labs Reviewed  CBC WITH DIFFERENTIAL/PLATELET  BASIC METABOLIC PANEL WITH GFR  SEDIMENTATION RATE  C-REACTIVE PROTEIN    EKG: None  Radiology: MR Brain Wo Contrast (neuro protocol) Result Date: 09/05/2024 EXAM: MRI BRAIN WITHOUT CONTRAST 09/05/2024 04:56:11 PM TECHNIQUE: Multiplanar multisequence MRI  of the head/brain was performed without the administration of intravenous contrast. COMPARISON: CT angio head and neck 09/05/2024 and CT head without contrast 08/28/2024. CLINICAL HISTORY: Neuro deficit, acute, stroke suspected. FINDINGS: BRAIN AND VENTRICLES: No acute infarct. No intracranial hemorrhage. No mass. No midline shift. No hydrocephalus. The sella is unremarkable. Normal flow voids. ORBITS: No acute abnormality. SINUSES AND MASTOIDS: Mucous retention cyst is present in the left maxillary sinus. BONES AND SOFT TISSUES: Normal marrow signal. No acute soft tissue abnormality. IMPRESSION: 1. No acute intracranial abnormality related to the suspected stroke. Electronically signed by: Lonni Necessary MD 09/05/2024 05:09 PM EST RP Workstation: [id number]   CT ANGIO HEAD NECK W WO CM Result Date: 09/05/2024 EXAM: CTA HEAD AND NECK WITHOUT AND WITH 09/05/2024 01:54:01 PM TECHNIQUE: CTA of the head and neck was performed without and with the administration of 75 mL of iohexol (OMNIPAQUE) 350 MG/ML injection. Multiplanar 2D and/or 3D reformatted images are provided for review. Automated exposure control, iterative reconstruction, and/or weight based adjustment of the mA/kV was utilized to reduce the radiation dose to as low as reasonably achievable. Stenosis of the internal carotid arteries measured using NASCET criteria. COMPARISON: CT head 08/28/2024 CLINICAL HISTORY: vision changes FINDINGS: CTA NECK: AORTIC ARCH AND ARCH VESSELS: Prominence of the main pulmonary artery measuring up to 3.7 cm in diameter which may reflect pulmonary arterial hypertension. Mild atherosclerosis of the visualized aortic arch. Common origin of the brachiocephalic and left common carotid arteries. No dissection or arterial injury. No significant stenosis of the brachiocephalic or subclavian arteries. CERVICAL CAROTID ARTERIES: The right carotid artery is patent from the origin to the skull base. There is mild tortuosity of the  proximal right common carotid artery and mild atherosclerosis at the right carotid bifurcation without hemodynamically significant stenosis. There is mild tortuosity of the distal right cervical ICA. The left carotid artery is patent from the origin to the skull base. There is mild atherosclerosis at the left carotid bifurcation without hemodynamically significant stenosis. No dissection or arterial injury. CERVICAL VERTEBRAL ARTERIES: The vertebral arteries are patent from the origins to the vertebrobasilar confluence. There is mild ostial stenosis of the right vertebral artery which may be congenital. No dissection or arterial injury. LUNGS AND MEDIASTINUM: Opacities in the bilateral upper lobes which could reflect scarring or fibrosis. Prominence of the main pulmonary artery measuring up to 3.7 cm in diameter which may reflect pulmonary arterial hypertension. SOFT TISSUES: Subcentimeter nodule in the right thyroid lobe. BONES: Degenerative changes in the visualized spine with disc space narrowing most pronounced  at C5-C6 and C6-C7. CTA HEAD: ANTERIOR CIRCULATION: The intracranial internal carotid arteries are patent bilaterally. There is a 2 x 1 mm medially directed outpouching along the anterior genu of the right cavernous ICA compatible with an aneurysm most likely at the origin of the superior apophyseal artery. There is an additional 2 x 2 mm inferiorly directed outpouching along the right supraclinoid ICA which may reflect an infundibulum versus additional small aneurysm. The middle cerebral arteries are patent bilaterally. The anterior cerebral arteries are patent bilaterally. POSTERIOR CIRCULATION: No significant stenosis of the posterior cerebral arteries. No significant stenosis of the basilar artery. No significant stenosis of the vertebral arteries. No aneurysm. OTHER: Remote lacunar infarct versus prominent perivascular space in the right basal ganglia. No dural venous sinus thrombosis on this  non-dedicated study. IMPRESSION: 1. No large vessel occlusion. 2. 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 3. 2 x 2 mm inferiorly directed outpouching along the right supraclinoid ICA, possibly an infundibulum versus an additional small aneurysm. 4. Mild atherosclerosis at the carotid bifurcations without hemodynamically significant stenosis. 5. Prominent main pulmonary artery measuring up to 3.7 cm, which may reflect pulmonary arterial hypertension. Electronically signed by: Donnice Mania MD 09/05/2024 02:14 PM EST RP Workstation: [id number]     Procedures   Medications Ordered in the ED  iohexol (OMNIPAQUE) 350 MG/ML injection 75 mL (75 mLs Intravenous Contrast Given 09/05/24 1339)    Clinical Course as of 09/05/24 1918  Fri Sep 05, 2024  1556 I spoke with Dr. Voncile with neurology who recommended MRI of brain. Will follow up for recommendations once we have results of MRI and inflammatory markers. [VK]  [id number] I spoke with Dr. Arora and if inflammatory markers are negative with negative MRI patient can follow up with outpatient neurology and ophtho. CRP is send out to [facility] with an unknown ETA of when this will be completed. Patient reports she needs to go home to take care of her house and I will plan to call the patient if CRP is abnormal that she needs to return to the ED. She is agreeable with this plan. [VK]  1917 CRP negative - patient stable for outpatient follow up. [VK]    Clinical Course User Index [VK] Kingsley, Zymeir Salminen K, DO                                 Medical Decision Making This patient presents to the ED with chief complaint(s) of right eye vision loss with pertinent past medical history of hypertension which further complicates the presenting complaint. The complaint involves an extensive differential diagnosis and also carries with  it a high risk of complications and morbidity.    The differential diagnosis includes with recent normal ocular exam, a retinal detachment or subchorionic hemorrhage less likely, considering CRAO or CRVO, CVA, TIA, temporal arteritis  Additional history obtained: Additional history obtained from N/A Records reviewed Care Everywhere/External Records and recent ED records  ED Course and Reassessment: On patient's arrival she is hemodynamically stable in no acute distress.  Was initially evaluated in triage and had labs and CTA of the head and neck performed.  Labs are within normal range.  ESR and CRP have been added on.  CTA of the head and neck incidentally showed 2 small aneurysms but no  obvious occlusion or significant narrowing of the vessels.  Plan will be to discuss with neurology for possible MRI in addition to the inflammatory markers.  Patient declined any pain medication at this time and will be closely reassessed.  Independent labs interpretation:  The following labs were independently interpreted: within normal range  Independent visualization of imaging: - I independently visualized the following imaging with scope of interpretation limited to determining acute life threatening conditions related to emergency care: CTA Head/Neck, MRI brain, which revealed no acute abnormality to explain symptoms  Consultation: - Consulted or discussed management/test interpretation w/ external professional: neurology  Consideration for admission or further workup: Patient has no emergent conditions requiring admission or further work-up at this time and is stable for discharge home with primary care, neurology and ophtho follow-up  Social Determinants of health: N/A    Amount and/or Complexity of Data Reviewed Labs: ordered. Radiology: ordered.       Final diagnoses:  Vision loss of right eye    ED Discharge Orders          Ordered    Ambulatory referral to Neurology        Comments: An appointment is requested in approximately: 2 weeks   09/05/24 1855               Kingsley, Nichollas Perusse K, DO 09/05/24 1857

## 2024-09-08 NOTE — Progress Notes (Deleted)
 Initial neurology clinic note  Brenda Adams MRN: [id number] DOB: [birth year]  Referring provider: Volanda Saupe, MD  Primary care provider: Volanda Saupe, MD  Reason for consult:  vision loss  Subjective:  This is [name], a 63 y.o. ***-handed female with a medical history of HTN, migraine, current smoker*** who presents to neurology clinic with ***. The patient is accompanied by ***.  *** Went to ED on 08/28/24 for complete loss of right eye vision for 3-5 seconds that then resolved After she had white spots more in medial aspect of vision Everything brighter and blurry in right eye Right eye pain - constant, not related to eye movement Has history of migraine but not associated with headache or typical migraine CT head and CT orbits were normal  She returned to the ED on 09/01/24 for intermittent blurry vision and color loss accompanied by light sensitivity, right neck pain, and headache in forehead Persistent for 4 days Seen by ophtho 3 days prior and was told everything was normal She was sent to ED by PCP at [facility] for carotid ultrasound She refused further imaging at that time though  PCP sent patient back to ED for work up for temporal arteritis. She mentioned occasional jaw pain. 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   No facility-administered encounter medications on file as of 09/12/2024.    PAST MEDICAL HISTORY: Past Medical History:  Diagnosis Date   Dislocated shoulder     PAST SURGICAL HISTORY: No  past surgical history on file.  ALLERGIES: Allergies  Allergen Reactions   Penicillins Hives    FAMILY HISTORY: No family history on file.  SOCIAL HISTORY: Social History   Tobacco Use   Smoking status: Some Days   Smokeless tobacco: Never  Substance Use Topics   Alcohol use: No   Drug use: No   Social History   Social History Narrative   Not on file    Objective:  Vital Signs:  There were no vitals taken for this visit.  ***  Labs and Imaging review: Internal labs: 09/05/24: CBC w/ diff unremarkable BMP unremarkable ESR 15 CRP 0.6  External labs: ***  Imaging/Procedures: CT head wo contrast (08/28/24): FINDINGS: Brain: No acute infarct or hemorrhage. Lateral ventricles and midline structures are unremarkable. No acute extra-axial fluid collections. No mass effect.   Vascular: No hyperdense vessel or unexpected calcification.   Skull: Normal. Negative for fracture or focal lesion.   Sinuses/Orbits: The bilateral globes and orbits are unremarkable. The paranasal sinuses are clear.   Other: None.   IMPRESSION: 1. No acute intracranial process.  CT orbits wo contrast (08/28/24): FINDINGS: Orbits: No orbital mass or evidence of inflammation. Normal appearance of the globes, optic nerve-sheath complexes, extraocular muscles, orbital fat and lacrimal glands.   Visible paranasal sinuses: Polypoid mucosal thickening versus mucous retention cysts within the LV other recess of the left maxillary sinus. Remaining paranasal sinuses are clear.   Soft tissues: Normal.   Osseous: No fracture or aggressive lesion.   Limited intracranial: No acute or significant finding.  IMPRESSION: 1. Unremarkable CT of the orbits.  ED ocular ultrasound (08/28/24): RIGHT EYE FINDINGS:     no foreign body noted in right eye     no evidence of retinal detachment of the right eye     no vitreous hemorrhage in right eye   CTA head and neck (09/05/24): FINDINGS:    CTA NECK:   AORTIC ARCH AND ARCH VESSELS: Prominence of the main pulmonary artery measuring up to 3.7 cm in diameter which may reflect pulmonary arterial hypertension. Mild atherosclerosis of the visualized aortic arch. Common origin of the brachiocephalic and left common carotid arteries. No dissection or arterial injury. No significant stenosis of the brachiocephalic or subclavian arteries.   CERVICAL CAROTID ARTERIES: The right carotid artery is patent from the origin to the skull base. There is mild tortuosity of the proximal right common carotid artery and mild atherosclerosis at the right carotid bifurcation without hemodynamically significant stenosis. There is mild tortuosity of the distal right cervical ICA. The left carotid artery is patent from the origin to the skull base. There is mild atherosclerosis at the left carotid bifurcation without hemodynamically significant stenosis. No dissection or arterial injury.   CERVICAL VERTEBRAL ARTERIES: The vertebral arteries are patent from the origins to the vertebrobasilar confluence. There is mild ostial stenosis of the right vertebral artery which may be congenital. No dissection or arterial injury.   LUNGS AND MEDIASTINUM: Opacities in the bilateral upper lobes which could reflect scarring or fibrosis. Prominence of the main pulmonary artery measuring up to 3.7 cm in diameter which may reflect pulmonary arterial hypertension.   SOFT TISSUES: Subcentimeter nodule in the right thyroid lobe.   BONES: Degenerative changes in the visualized spine with disc space narrowing most pronounced at C5-C6 and C6-C7.   CTA HEAD:   ANTERIOR CIRCULATION: The intracranial internal carotid arteries are patent bilaterally. There is a 2 x 1 mm medially directed outpouching along the anterior genu of the right cavernous ICA compatible with an aneurysm most likely at the origin of the superior apophyseal artery. There is an additional 2 x  2 mm inferiorly directed outpouching along the right supraclinoid ICA which may reflect an infundibulum versus additional small aneurysm. The middle cerebral arteries are patent bilaterally. The anterior cerebral arteries are patent bilaterally.   POSTERIOR CIRCULATION: No significant stenosis of the posterior cerebral arteries. No significant stenosis of the basilar artery. No significant stenosis of the vertebral arteries. No aneurysm.   OTHER: Remote lacunar infarct versus prominent perivascular space in the right basal ganglia. No dural venous sinus thrombosis on this non-dedicated study.   IMPRESSION: 1. No large vessel occlusion. 2. 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 3. 2 x 2 mm inferiorly directed outpouching along the right supraclinoid ICA, possibly an infundibulum versus an additional small aneurysm. 4. Mild atherosclerosis at the carotid bifurcations without hemodynamically significant stenosis. 5. Prominent main pulmonary artery measuring up to 3.7 cm, which may reflect pulmonary arterial hypertension.  MRI brain wo contrast (09/05/24): FINDINGS:   BRAIN AND VENTRICLES: No acute infarct. No intracranial hemorrhage. No mass. No midline shift. No hydrocephalus. The sella is unremarkable. Normal flow voids.   ORBITS: No acute abnormality.   SINUSES AND MASTOIDS: Mucous retention cyst is present in the left maxillary sinus.   BONES AND SOFT TISSUES: Normal marrow signal. No acute soft tissue abnormality.   IMPRESSION:  1. No acute intracranial abnormality related to the suspected stroke. ***  Assessment/Plan:  Brenda Adams is a 63 y.o. female who presents for evaluation of ***. *** has a relevant medical history of ***. *** neurological examination is pertinent for ***. Available diagnostic data is significant for ***.  This constellation of symptoms and objective data would most likely localize to ***. ***  PLAN: -Blood work: *** ***  -Return to clinic ***  The impression above as well as the plan as outlined below were extensively discussed with the patient (in the company of ***) who voiced understanding. All questions were answered to their satisfaction.  The patient was counseled on pertinent fall precautions per the printed material provided today, and as noted under the Patient Instructions section below.***  When available, results of the above investigations and possible further recommendations will be communicated to the patient via telephone/MyChart. Patient to call office if not contacted after expected testing turnaround time.   Total time spent reviewing records, interview, history/exam, documentation, and coordination of care on day of encounter:  *** min   Thank you for allowing me to participate in patient's care.  If I can answer any additional questions, I would be pleased to do so.  Venetia Potters, MD   CC: Brenda Saupe, MD [address] [facility] [place] [address]  CC: Referring provider: Volanda Saupe, MD [address] [place],  [place] [address]

## 2024-09-12 ENCOUNTER — Ambulatory Visit: Admitting: Neurology

## 2024-09-23 ENCOUNTER — Other Ambulatory Visit (HOSPITAL_BASED_OUTPATIENT_CLINIC_OR_DEPARTMENT_OTHER): Payer: Self-pay | Admitting: Family Medicine

## 2024-09-23 DIAGNOSIS — J341 Cyst and mucocele of nose and nasal sinus: Secondary | ICD-10-CM

## 2024-09-30 NOTE — Progress Notes (Unsigned)
   NEUROLOGY CONSULTATION NOTE  Brenda Adams MRN: [id number] DOB: 02/14/1961  Referring provider: Richerd MARLA Later, DO ED referral) Primary care provider: Thedore Legions, MD  Reason for consult:  vision loss in right eye.  Assessment/Plan:   ***   Subjective:  Brenda Adams is a 63 year old ***-handed female with past medical history of hypertension who presents for vision loss in right eye.  History supplemented by ED note.  On 08/24/2024, she experienced an episode of complete vision loss in the right eye ***. Lasted 3-5 seconds.  Rigth eye pain.  Went to ED on 11/6 - CT orbits and ocular ultrasound were negative and discharged on tetracaine  drops.   Headacne and neck pain.  Saw PCP at [facility] on 11/10 and advised to go to ED for carotid US .  Plan was to perform CTA head and neck but patient did not want to stay and requested to leave.  Jaw pain - advised by PCP to return to ED on 11/14 for temporal arteritis work up.  Sed rate and CRP were 15 and 0.6 respectively.  MRI of brain revealed no acute intracranial abnormality.  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       PAST MEDICAL HISTORY: Past Medical History:  Diagnosis Date   Dislocated shoulder     PAST SURGICAL HISTORY: No past surgical history on file.  MEDICATIONS: Current Outpatient Medications on File Prior to Visit  Medication Sig Dispense Refill   loratadine (CLARITIN) 10 MG tablet Take 10 mg by mouth daily.     No current facility-administered medications on file prior to visit.    ALLERGIES: Allergies  Allergen Reactions   Penicillins Hives    FAMILY HISTORY: No family history on file.  Objective:  *** General: No acute distress.  Patient appears well-groomed.   Head:  Normocephalic/atraumatic Eyes:  fundi examined but not visualized Neck: supple,  no paraspinal tenderness, full range of motion Heart: regular rate and rhythm Neurological Exam: Mental status: alert and oriented to person, place, and time, speech fluent and not dysarthric, language intact. Cranial nerves: CN I: not tested CN II: pupils equal, round and reactive to light, visual fields intact CN III, IV, VI:  full range of motion, no nystagmus, no ptosis CN V: facial sensation intact. CN VII: upper and lower face symmetric CN VIII: hearing intact CN IX, X: gag intact, uvula midline CN XI: sternocleidomastoid and trapezius muscles intact CN XII: tongue midline Bulk & Tone: normal, no fasciculations. Motor:  muscle strength 5/5 throughout Sensation:  Pinprick and vibratory sensation intact. Deep Tendon Reflexes:  2+ throughout,  toes downgoing.   Finger to nose testing:  Without dysmetria.   Gait:  Normal station and stride.  Romberg negative.    Thank you for allowing me to take part in the care of this patient.  Juliene Dunnings, DO  CC: ***

## 2024-10-01 ENCOUNTER — Ambulatory Visit: Admitting: Neurology

## 2024-10-01 ENCOUNTER — Encounter: Payer: Self-pay | Admitting: Neurology

## 2024-10-01 VITALS — BP 120/76 | HR 72 | Ht 68.0 in | Wt 234.0 lb

## 2024-10-01 DIAGNOSIS — G43501 Persistent migraine aura without cerebral infarction, not intractable, with status migrainosus: Secondary | ICD-10-CM | POA: Diagnosis not present

## 2024-10-01 MED ORDER — TOPIRAMATE 25 MG PO TABS: 25.0000 mg | ORAL_TABLET | Freq: Every day | ORAL | 5 refills | Status: AC

## 2024-10-01 NOTE — Patient Instructions (Signed)
°  Start TOPIRAMATE 25MG  AT BEDTIME.  Contact us  in 4 weeks with update and we can increase dose if needed. Limit use of pain relievers to no more than 9 days out of the month.  These medications include acetaminophen, NSAIDs (ibuprofen/Advil/Motrin, naproxen/Aleve, triptans (Imitrex/sumatriptan), Excedrin, and narcotics.  This will help reduce risk of rebound headaches. Be aware of common food triggers Routine exercise Stay adequately hydrated (aim for 64 oz water daily) Keep headache diary Maintain proper stress management Maintain proper sleep hygiene Do not skip meals Consider supplements:  magnesium citrate 400mg  daily, riboflavin 400mg  daily, coenzyme Q10 100mg  three times daily.

## 2024-10-04 ENCOUNTER — Ambulatory Visit (HOSPITAL_BASED_OUTPATIENT_CLINIC_OR_DEPARTMENT_OTHER)

## 2024-10-04 ENCOUNTER — Encounter (HOSPITAL_BASED_OUTPATIENT_CLINIC_OR_DEPARTMENT_OTHER): Payer: Self-pay

## 2024-10-28 ENCOUNTER — Ambulatory Visit (HOSPITAL_BASED_OUTPATIENT_CLINIC_OR_DEPARTMENT_OTHER)
Admission: RE | Admit: 2024-10-28 | Discharge: 2024-10-28 | Disposition: A | Source: Ambulatory Visit | Attending: Family Medicine | Admitting: Family Medicine

## 2024-10-28 DIAGNOSIS — J341 Cyst and mucocele of nose and nasal sinus: Secondary | ICD-10-CM | POA: Diagnosis present

## 2025-04-07 ENCOUNTER — Ambulatory Visit: Admitting: Neurology
